# Patient Record
Sex: Female | Born: 1955 | Race: White | Hispanic: No | Marital: Married | Smoking: Former smoker
Health system: Southern US, Community
[De-identification: ages and names within clinical notes are randomized; demographics above are authoritative.]

## PROBLEM LIST (undated history)

## (undated) DIAGNOSIS — C50919 Malignant neoplasm of unspecified site of unspecified female breast: Secondary | ICD-10-CM

## (undated) HISTORY — DX: Malignant neoplasm of unspecified site of unspecified female breast: C50.919

---

## 2000-08-07 ENCOUNTER — Other Ambulatory Visit: Admission: RE | Admit: 2000-08-07 | Discharge: 2000-08-07 | Payer: Self-pay | Admitting: Family Medicine

## 2004-05-22 ENCOUNTER — Ambulatory Visit: Payer: Self-pay | Admitting: Family Medicine

## 2004-10-06 ENCOUNTER — Ambulatory Visit: Payer: Self-pay | Admitting: Family Medicine

## 2008-03-04 ENCOUNTER — Emergency Department (HOSPITAL_BASED_OUTPATIENT_CLINIC_OR_DEPARTMENT_OTHER): Admission: EM | Admit: 2008-03-04 | Discharge: 2008-03-04 | Payer: Self-pay | Admitting: Emergency Medicine

## 2009-06-17 IMAGING — CT CT HEAD W/O CM
1 series · 16 of 30 positions shown, 20 images · non-contrast
Comparison: None

CLINICAL DATA: Accident.  Dizziness.

CT HEAD WITHOUT CONTRAST
TECHNIQUE: Contiguous axial images were obtained from the base of
the skull through the vertex without contrast.

[Series 2: head 4.8 h37s · axial · 0.44mm/px · z∈[-120,+16]mm · 16 of 32 slices shown, 20 images]
[im 2/32  brain]
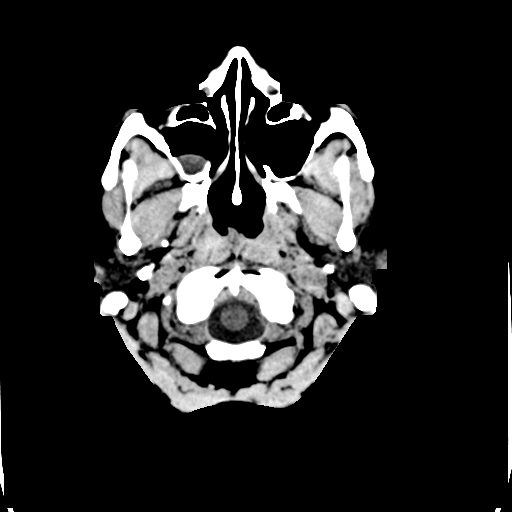
[im 2/32  bone]
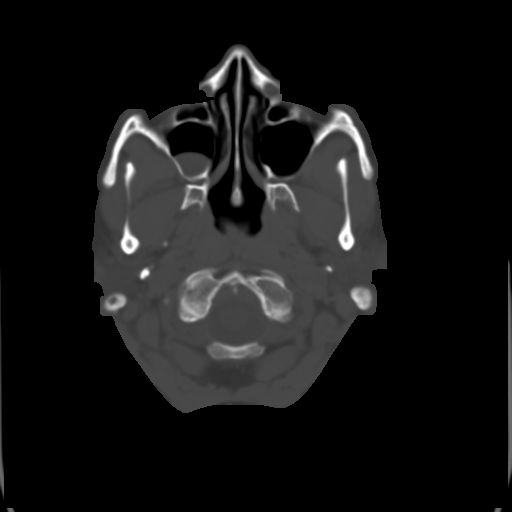
[im 4/32  brain]
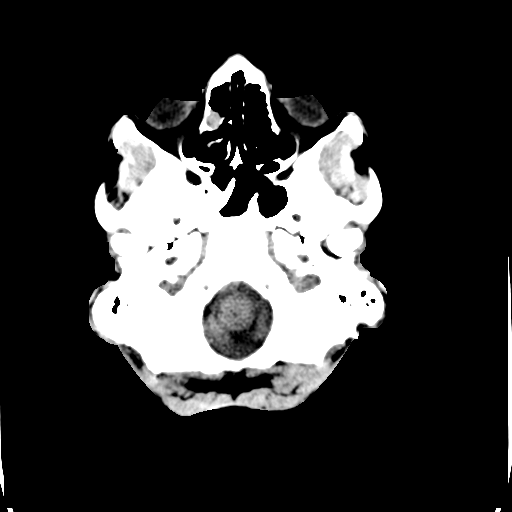
[im 6/32  brain]
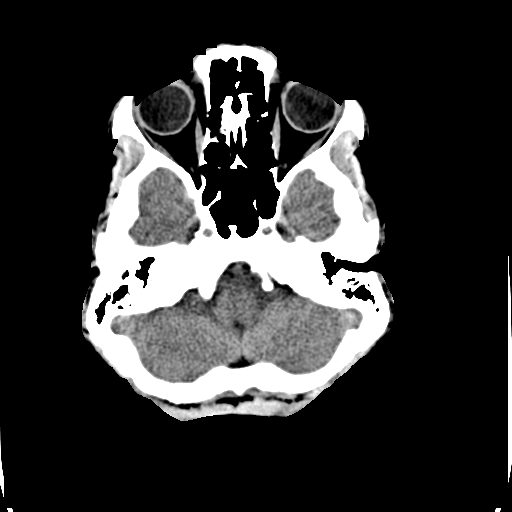
[im 8/32  brain]
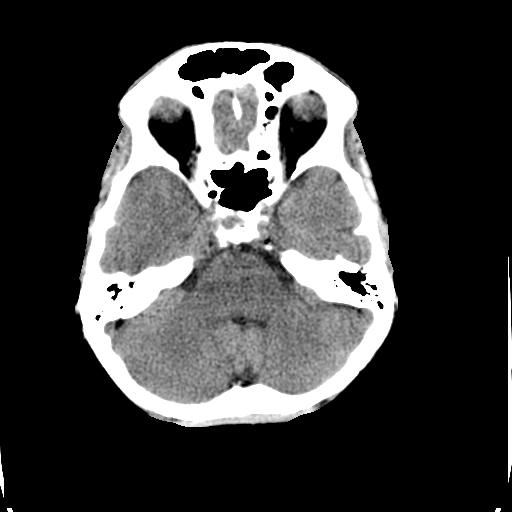
[im 9/32  brain]
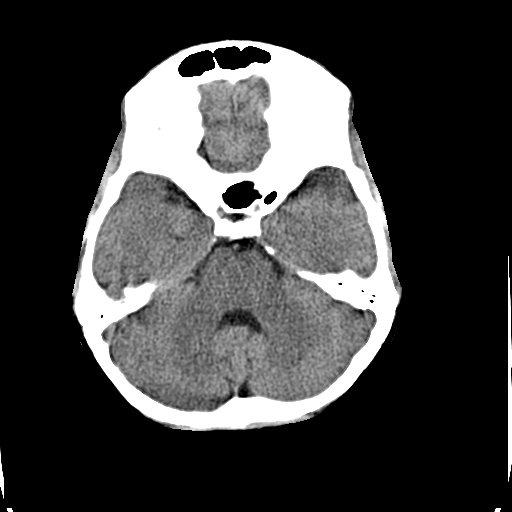
[im 9/32  bone]
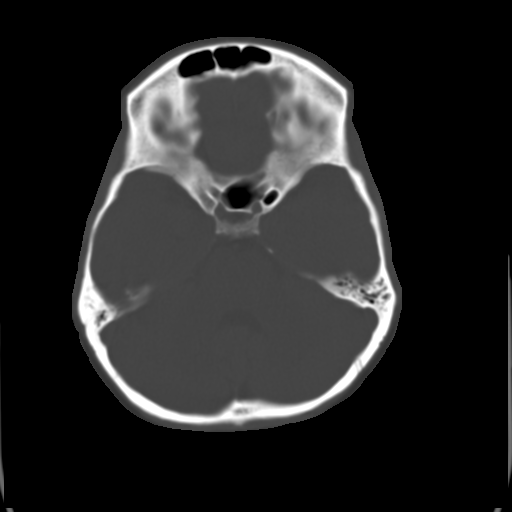
[im 11/32  brain]
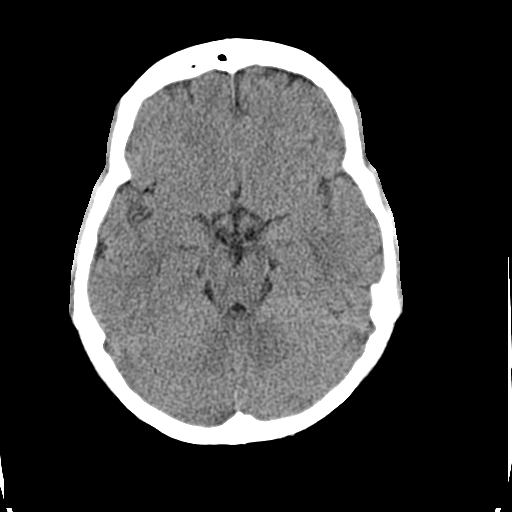
[im 13/32  brain]
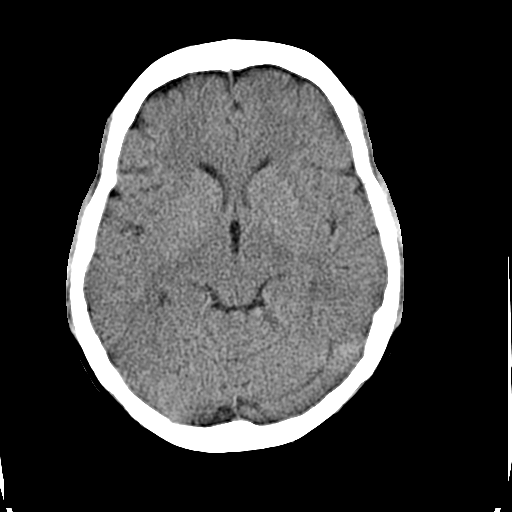
[im 15/32  brain]
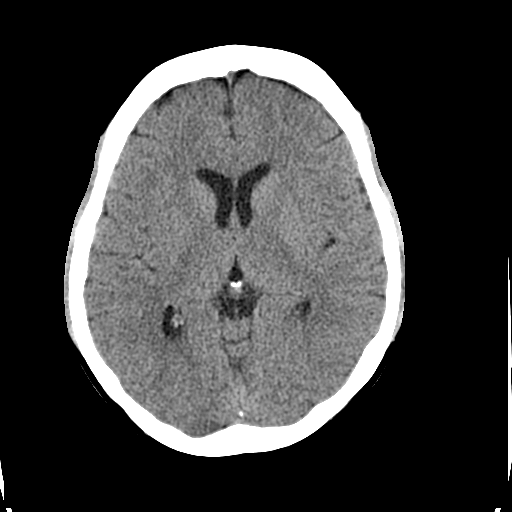
[im 17/32  brain]
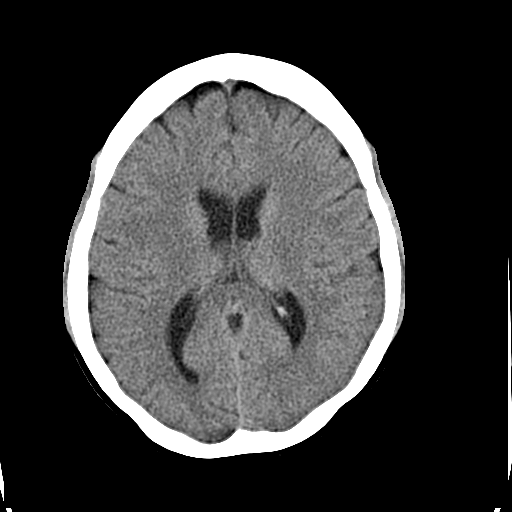
[im 17/32  bone]
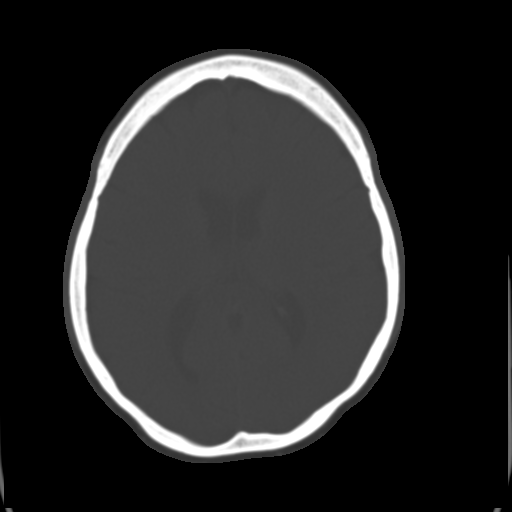
[im 19/32  brain]
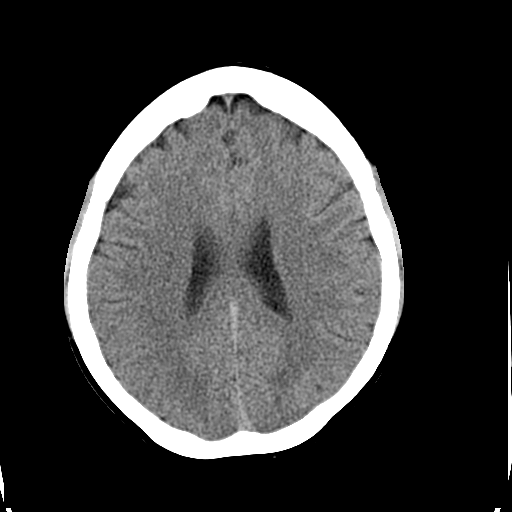
[im 21/32  brain]
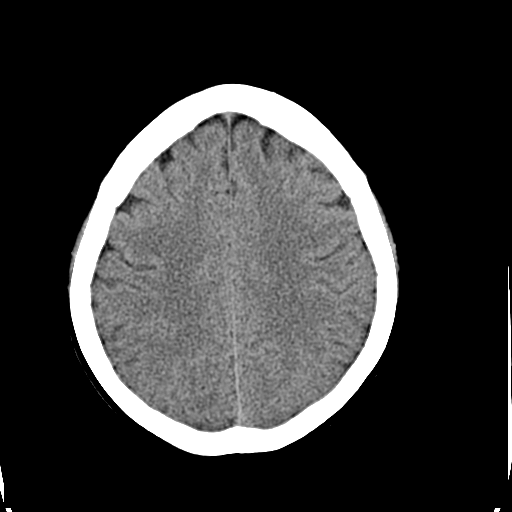
[im 23/32  brain]
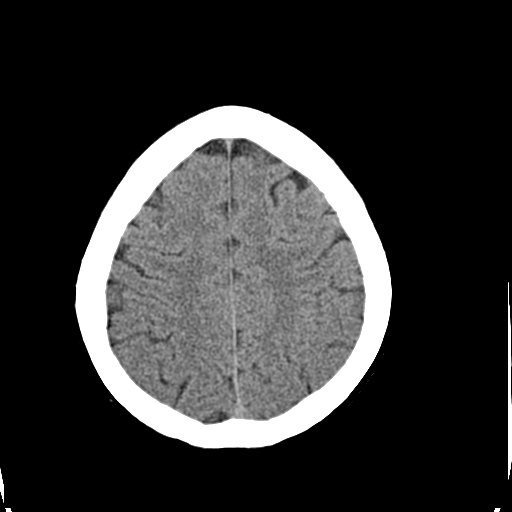
[im 24/32  brain]
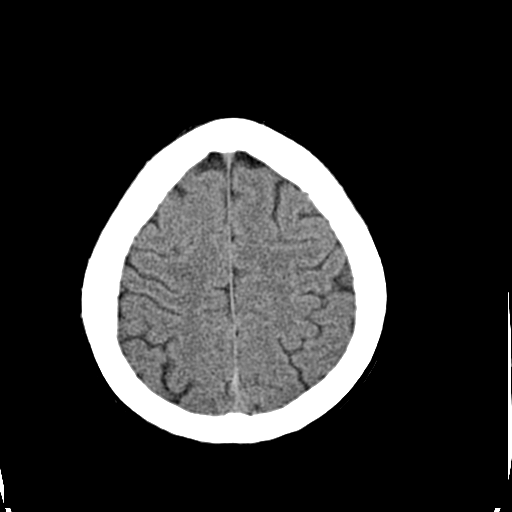
[im 24/32  bone]
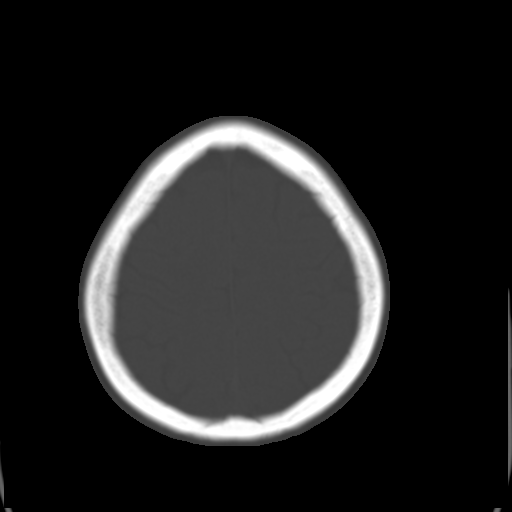
[im 26/32  brain]
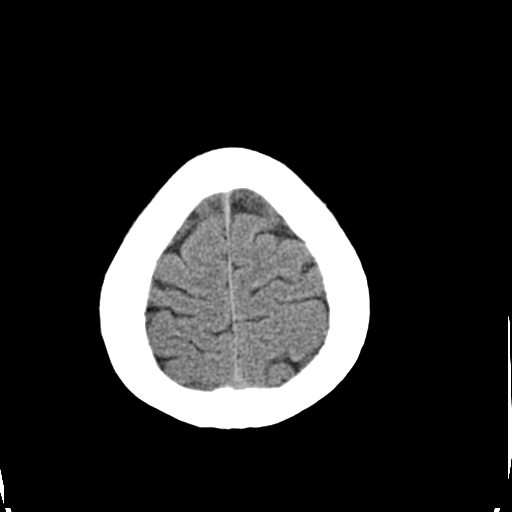
[im 28/32  brain]
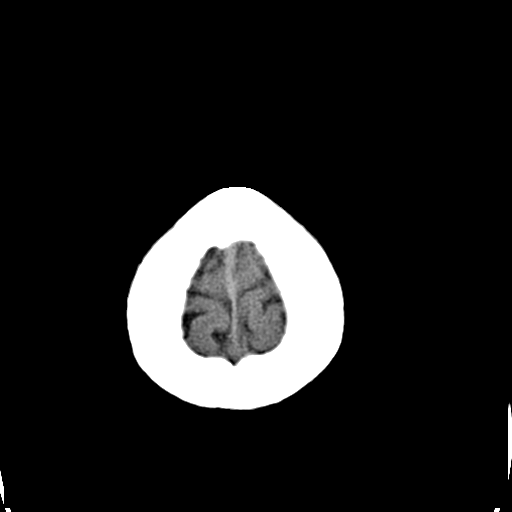
[im 30/32  brain]
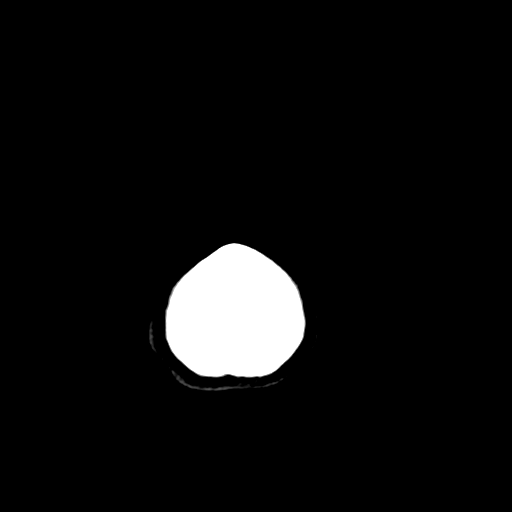

[16 of 30 positions shown; findings below may reference images not displayed]

FINDINGS: The brain has a normal appearance without evidence of
atrophy, infarction, mass lesion, hemorrhage, hydrocephalus or
extra-axial collection.  The calvarium is unremarkable.  The
sinuses, middle ears and mastoids are clear.
IMPRESSION: Negative head CT

## 2011-03-06 LAB — URINALYSIS, ROUTINE W REFLEX MICROSCOPIC
Bilirubin Urine: NEGATIVE
Hgb urine dipstick: NEGATIVE
Ketones, ur: NEGATIVE
Protein, ur: NEGATIVE
pH: 6

## 2011-03-06 LAB — DIFFERENTIAL
Basophils Absolute: 0
Basophils Relative: 1
Eosinophils Absolute: 0.3
Eosinophils Relative: 6 — ABNORMAL HIGH
Lymphocytes Relative: 39
Lymphs Abs: 1.9
Neutro Abs: 2

## 2011-03-06 LAB — CBC
MCHC: 33.9
Platelets: 158
RDW: 11.8
WBC: 4.7

## 2011-03-06 LAB — POCT TOXICOLOGY PANEL

## 2011-03-06 LAB — COMPREHENSIVE METABOLIC PANEL
AST: 23
Alkaline Phosphatase: 76
CO2: 27
Calcium: 9.1
Chloride: 107
Creatinine, Ser: 0.9
Glucose, Bld: 84
Potassium: 4
Total Bilirubin: 0.5

## 2011-03-06 LAB — POCT CARDIAC MARKERS
Myoglobin, poc: 57.7
Troponin i, poc: <0.05

## 2013-06-04 DIAGNOSIS — C50919 Malignant neoplasm of unspecified site of unspecified female breast: Secondary | ICD-10-CM

## 2013-06-04 HISTORY — DX: Malignant neoplasm of unspecified site of unspecified female breast: C50.919

## 2014-05-20 ENCOUNTER — Telehealth: Payer: Self-pay | Admitting: *Deleted

## 2014-05-20 NOTE — Telephone Encounter (Signed)
Received referral from [facility] for [facility].  Confirmed appointment for 05/24/14 at 1pm with [name].  Contact information given. Encouraged her to call with any questions or needs.

## 2014-05-24 ENCOUNTER — Ambulatory Visit (HOSPITAL_BASED_OUTPATIENT_CLINIC_OR_DEPARTMENT_OTHER): Payer: PRIVATE HEALTH INSURANCE | Admitting: Genetic Counselor

## 2014-05-24 ENCOUNTER — Other Ambulatory Visit: Payer: PRIVATE HEALTH INSURANCE

## 2014-05-24 ENCOUNTER — Encounter: Payer: Self-pay | Admitting: Genetic Counselor

## 2014-05-24 DIAGNOSIS — C50919 Malignant neoplasm of unspecified site of unspecified female breast: Secondary | ICD-10-CM

## 2014-05-24 DIAGNOSIS — D0590 Unspecified type of carcinoma in situ of unspecified breast: Secondary | ICD-10-CM

## 2014-05-24 DIAGNOSIS — Z803 Family history of malignant neoplasm of breast: Secondary | ICD-10-CM

## 2014-05-24 NOTE — Progress Notes (Signed)
REFERRING PROVIDER: Elodia Florence, MD   PRIMARY PROVIDER:  No primary care provider on file.  PRIMARY REASON FOR VISIT:  1. Breast cancer, unspecified laterality   2. Family history of breast cancer      HISTORY OF PRESENT ILLNESS:   Ms. Cremeens, a 58 y.o. female, was seen for a [facility] cancer genetics consultation at the request of Dr. Bobby Rumpf due to a personal and family history of cancer.  Ms. Searles presents to clinic today to discuss the possibility of a hereditary predisposition to cancer, genetic testing, and to further clarify her future cancer risks, as well as potential cancer risks for family members.   CANCER HISTORY:   No history exists.     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 This was treated with lumpectomy, but she is waiting on the genetic test results to determine if she needs radiation, or if she will go back to surgery for a mastectomy.     HORMONAL RISK FACTORS:  Menarche was at age 34.  First live birth at age 51.  OCP use for approximately 9 years.  Ovaries intact: no.  Hysterectomy: yes.  Menopausal status: postmenopausal.  HRT use: 10 years. Colonoscopy: yes; normal. Mammogram within the last year: yes. Number of breast biopsies: 1. Up to date with pelvic exams:  n/a. Any excessive radiation exposure in the past:  no  Past Medical History  Diagnosis Date  . Breast cancer 2015    DCIS ER-/PR-    History reviewed. No pertinent past surgical history.  History   Social History  . Marital Status: Married    Spouse Name: N/A    Number of Children: 1  . Years of Education: N/A   Social History Main Topics  . Smoking status: Former Smoker -- 0.50 packs/day for 30 years    Types: Cigarettes    Quit date: 05/25/2007  . Smokeless tobacco: None  . Alcohol Use: Yes  . Drug Use: No  . Sexual Activity: None   Other Topics Concern  . None    Social History Narrative  . None     FAMILY HISTORY:  We obtained a detailed, 4-generation family history.  Significant diagnoses are listed below: Family History  Problem Relation Age of Onset  . Breast cancer Sister 37   The patient's mother died of alcoholic cirrhosis of the liver.  She had one sister who died in her 51s and her parents died of natural causes. Ms. Swartz had one maternal cousin who at age 2 was diagnosed with cancer of her jaw.  She died at age 53. Ms. Stammen father left the family when Ms. Emile was 8 and she has never seen him again.  She knows that he had two maternal half brothers, but does not know any information about them or their families.  Therefore, she has a limited paternal family history.  Patient's maternal ancestors are of Caucasian descent, and paternal ancestors are of Caucasian descent. There is no reported Ashkenazi Jewish ancestry. There is no known consanguinity.  GENETIC COUNSELING ASSESSMENT: ARMINA GALLOWAY is a 58 y.o. female with a personal and family history of breast cancer which somewhat suggestive of a hereditary cancer syndrome and predisposition to cancer. We, therefore, discussed and recommended the following at today's visit.   DISCUSSION: We reviewed the characteristics, features and inheritance patterns of hereditary cancer syndromes. We focused  our discussion on BRCA mutations, but we also discussed that PALB2 mutations are seen with ER-/PR- breast cancers.  We also discussed genetic testing, including the appropriate family members to test, the process of testing, insurance coverage and turn-around-time for results. We discussed the implications of a negative, positive and/or variant of uncertain significant result. In order to get genetic test results in a timely manner so that Ms. Wetherell can use these genetic test results for surgical or radiation decisions, we recommended Ms. Schoonmaker pursue genetic testing for the [place]. If  this test is negative, we then recommend Ms. Mamone pursue reflex genetic testing to the BreastNext gene panel. The BreastNext gene panel offered by [facility] and includes sequencing and rearrangement analysis for the following 17 genes: ATM, BARD1, BRCA1, BRCA2, BRIP1, CDH1, CHEK2, MRE11A, MUTYH, NBN, NF1, PALB2, PTEN, RAD50, RAD51C, RAD51D, and TP53.  PLAN: After considering the risks, benefits, and limitations,Ms. Radford  provided informed consent to pursue genetic testing and the blood sample was sent to [facility] for analysis of the BreastNext Panel testing. Results should be available within approximately ~2 weeks' time for [facility].  If this is negative we will continue with testing the BreastNext panel testing which will take an additional 4 weeks, at which point they will be disclosed by telephone to Ms. Hink, as will any additional recommendations warranted by these results. Ms. Bonifield will receive a summary of her genetic counseling visit and a copy of her results once available. This information will also be available in Epic. We encouraged Ms. Majette to remain in contact with cancer genetics annually so that we can continuously update the family history and inform her of any changes in cancer genetics and testing that may be of benefit for her family. Ms. Ehrlich questions were answered to her satisfaction today. Our contact information was provided should additional questions or concerns arise.  Lastly, we encouraged Ms. Garry to remain in contact with cancer genetics annually so that we can continuously update the family history and inform her of any changes in cancer genetics and testing that may be of benefit for this family.   Ms.  Kea questions were answered to her satisfaction today. Our contact information was provided should additional questions or concerns arise. Thank you for the referral and allowing [place] to share in the care of your patient.   Karen P.  Florene Glen, [place], [facility] Certified Genetic Counselor [email or web address] phone: [phone]  The patient was seen for a total of 60 minutes in face-to-face genetic counseling.  This patient was discussed with Drs. Magrinat, Lindi Adie and/or Burr Medico who agrees with the above.    _______________________________________________________________________ For Office Staff:  Number of people involved in session: 2 Was an Intern/ student involved with case: no

## 2014-06-09 ENCOUNTER — Encounter: Payer: Self-pay | Admitting: Genetic Counselor

## 2014-06-09 DIAGNOSIS — Z1379 Encounter for other screening for genetic and chromosomal anomalies: Secondary | ICD-10-CM | POA: Insufficient documentation

## 2014-06-09 DIAGNOSIS — C50919 Malignant neoplasm of unspecified site of unspecified female breast: Secondary | ICD-10-CM | POA: Insufficient documentation

## 2014-06-09 DIAGNOSIS — Z803 Family history of malignant neoplasm of breast: Secondary | ICD-10-CM | POA: Insufficient documentation

## 2014-06-23 ENCOUNTER — Telehealth: Payer: Self-pay | Admitting: Genetic Counselor

## 2014-06-23 NOTE — Telephone Encounter (Signed)
LM with good news and asked that she call back.

## 2014-07-09 ENCOUNTER — Encounter: Payer: Self-pay | Admitting: Genetic Counselor

## 2014-07-09 NOTE — Progress Notes (Signed)
HPI: Ms. Caywood was previously seen in the [place] clinic due to a personal and family history of cancer and concerns regarding a hereditary predisposition to cancer. Please refer to our prior cancer genetics clinic note for more information regarding Ms. Majeed's medical, social and family histories, and our assessment and recommendations, at the time. Ms. Rainbow recent genetic test results were disclosed to her, as were recommendations warranted by these results. These results and recommendations are discussed in more detail below.  GENETIC TEST RESULTS: At the time of Ms. Postema's visit, we recommended she pursue genetic testing of the BreastNext gene panel. The BreastNext gene panel offered by [facility] and includes sequencing and rearrangement analysis for the following 17 genes: ATM, BARD1, BRCA1, BRCA2, BRIP1, CDH1, CHEK2, MRE11A, MUTYH, NBN, NF1, PALB2, PTEN, RAD50, RAD51C, RAD51D, and TP53. The report date is June 21, 2014.  Testing was performed at [facility]. Genetic testing was normal, and did not reveal a deleterious mutation in these genes. The test report has been scanned into EPIC and is located under the Media tab.   We tried reaching Ms. Eberwein several times by phone, but were never able to reach her.  She called and left messages stating that she could not talk on her phone during the day at work and to please leave information about the test result on her voicemail. This was done, but we never did speak with her directly. Please have her call our office if she has any questions.  CANCER SCREENING RECOMMENDATIONS: This result is reassuring and suggests that Ms. Sherk's personal and family history of cancer was most likely not due to an inherited predisposition associated with one of these genes. Most cancers happen by chance and this negative test, along with details of her family history, suggests that her cancer falls into this category.  We, therefore, recommended she continue to follow the cancer management and screening guidelines provided by her oncology and primary providers.   RECOMMENDATIONS FOR FAMILY MEMBERS: Women in this family might be at some increased risk of developing cancer, over the general population risk, simply due to the family history of cancer. We recommended women in this family have a yearly mammogram beginning at age 82, or 53 years younger than the earliest onset of cancer, an an annual clinical breast exam, and perform monthly breast self-exams. Women in this family should also have a gynecological exam as recommended by their primary provider. All family members should have a colonoscopy by age 66.  FOLLOW-UP: Lastly, cancer genetics is a rapidly advancing field and it is possible that new genetic tests will be appropriate for Ms. Broome and/or her family members in the future. We encourage her to remain in contact with cancer genetics on an annual basis so we can update her personal and family histories and let her know of advances in cancer genetics that may benefit this family.   Our contact number is below. Ms. Bryner is welcome to call us at anytime with additional questions or concerns.   [name], MS, [facility] Certified Genetic Counselor [email or web address]
